# Patient Record
Sex: Female | Born: 1965 | Marital: Married | Smoking: Former smoker
Health system: Southern US, Community
[De-identification: ages and names within clinical notes are randomized; demographics above are authoritative.]

---

## 2013-11-28 ENCOUNTER — Encounter: Payer: Self-pay | Admitting: Internal Medicine

## 2013-11-28 ENCOUNTER — Ambulatory Visit (INDEPENDENT_AMBULATORY_CARE_PROVIDER_SITE_OTHER): Payer: BC Managed Care – PPO | Admitting: Internal Medicine

## 2013-11-28 VITALS — BP 121/80 | HR 64 | Ht 59.0 in | Wt 120.0 lb

## 2013-11-28 DIAGNOSIS — R002 Palpitations: Secondary | ICD-10-CM

## 2013-11-28 NOTE — Progress Notes (Signed)
OFFICE NOTE  Chief Complaint:  Palpitations  Primary Care Physician: No PCP Per Patient  HPI:  Alyssa Silva Is a pleasant 48 year old female who is the daughter of one of my patients. She is of Asian descent however grew up in [place] and speaks [place]. She presents today for recent palpitations. She works in a busy job with the [employer] and is under significant amount of stress. Recently she's been having more palpitations. She reports that she had fewer palpitations when she was exercising but has not exercised recently. Also, her mother recently had an episode where she passed out while in [place]. I believe this is been adding some stress to her. Her palpitations seem to be worse at night or when resting. She feels that her heart rate could just jump up and any time. She has increased her caffeine use recently drinking 1-2 cups of coffee a day. She notes that sometimes after consuming caffeine her palpitations are worse. She denies any significant chest pain or worsening shortness of breath with exertion. She has no medical problems that have been diagnosed and she is not on any medications.  PMHx:  History reviewed. No pertinent past medical history.  History reviewed. No pertinent past surgical history.  FAMHx:  Family History  Problem Relation Age of Onset  . Hypertension Father   . Diabetes Father   . Colon cancer Father   . Pancreatic cancer Brother   . Nephrolithiasis Maternal Grandmother   . Nephrolithiasis Paternal Grandmother     SOCHx:   reports that she has quit smoking. She has never used smokeless tobacco. She reports that she drinks alcohol. She reports that she does not use illicit drugs.  ALLERGIES:  No Known Allergies  ROS: A comprehensive review of systems was negative except for: Cardiovascular: positive for palpitations  HOME MEDS: No current outpatient prescriptions on file.   No current facility-administered medications for this visit.     LABS/IMAGING: No results found for this or any previous visit (from the past 48 hour(s)). No results found.  VITALS: BP 121/80 mmHg  Pulse 64  Ht 4\' 11"  (1.499 m)  Wt 120 lb (54.432 kg)  BMI 24.22 kg/m2  EXAM: General appearance: alert and no distress Neck: no carotid bruit and no JVD Lungs: clear to auscultation bilaterally Heart: regular rate and rhythm, S1, S2 normal, no murmur, click, rub or gallop Abdomen: soft, non-tender; bowel sounds normal; no masses,  no organomegaly Extremities: extremities normal, atraumatic, no cyanosis or edema Pulses: 2+ and symmetric Skin: Skin color, texture, turgor normal. No rashes or lesions Neurologic: Grossly normal Psych: Anxious  EKG: Normal sinus rhythm at 64  ASSESSMENT: 1. Palpitations  PLAN: 1.   [name] is describing palpitations which have become more frequent recently but she's had them for several years. She noticed them less frequently when she was exercising and seemed to have less stress. Recently she's increased her caffeine intake, is having poor sleep and less exercise. I think these are all contributing to her palpitations. She denies any chest pain or worsening shortness of breath. Her EKG is normal. She has no known medical problems and does not take medications. I offered to place her on a monitor for. A time to evaluate for her palpitations, but she would like to make diet dietary and lifestyle modifications first. If her symptoms do not improve, she may need to be monitored. We might also consider a treadmill exercise stress test however it would be unlikely that these  symptoms are ischemic.  Follow-up with me can be on an as-needed basis. Thank you for the kind referral.  Pixie Casino, MD, [facility] Attending Cardiologist [facility]  Alyssa Silva 11/28/2013, 1:30 PM

## 2013-11-28 NOTE — Patient Instructions (Signed)
Your physician recommends that you schedule a follow-up appointment as needed.   Palpitations A palpitation is the feeling that your heartbeat is irregular or is faster than normal. It may feel like your heart is fluttering or skipping a beat. Palpitations are usually not a serious problem. However, in some cases, you may need further medical evaluation. CAUSES  Palpitations can be caused by:  Smoking.  Caffeine or other stimulants, such as diet pills or energy drinks.  Alcohol.  Stress and anxiety.  Strenuous physical activity.  Fatigue.  Certain medicines.  Heart disease, especially if you have a history of irregular heart rhythms (arrhythmias), such as atrial fibrillation, atrial flutter, or supraventricular tachycardia.  An improperly working pacemaker or defibrillator. DIAGNOSIS  To find the cause of your palpitations, your health care provider will take your medical history and perform a physical exam. Your health care provider may also have you take a test called an ambulatory electrocardiogram (ECG). An ECG records your heartbeat patterns over a 24-hour period. You may also have other tests, such as:  Transthoracic echocardiogram (TTE). During echocardiography, sound waves are used to evaluate how blood flows through your heart.  Transesophageal echocardiogram (TEE).  Cardiac monitoring. This allows your health care provider to monitor your heart rate and rhythm in real time.  Holter monitor. This is a portable device that records your heartbeat and can help diagnose heart arrhythmias. It allows your health care provider to track your heart activity for several days, if needed.  Stress tests by exercise or by giving medicine that makes the heart beat faster. TREATMENT  Treatment of palpitations depends on the cause of your symptoms and can vary greatly. Most cases of palpitations do not require any treatment other than time, relaxation, and monitoring your symptoms. Other  causes, such as atrial fibrillation, atrial flutter, or supraventricular tachycardia, usually require further treatment. HOME CARE INSTRUCTIONS   Avoid:  Caffeinated coffee, tea, soft drinks, diet pills, and energy drinks.  Chocolate.  Alcohol.  Stop smoking if you smoke.  Reduce your stress and anxiety. Things that can help you relax include:  A method of controlling things in your body, such as your heartbeats, with your mind (biofeedback).  Yoga.  Meditation.  Physical activity such as swimming, jogging, or walking.  Get plenty of rest and sleep. SEEK MEDICAL CARE IF:   You continue to have a fast or irregular heartbeat beyond 24 hours.  Your palpitations occur more often. SEEK IMMEDIATE MEDICAL CARE IF:  You have chest pain or shortness of breath.  You have a severe headache.  You feel dizzy or you faint. MAKE SURE YOU:  Understand these instructions.  Will watch your condition.  Will get help right away if you are not doing well or get worse. Document Released: 12/27/1999 Document Revised: 01/03/2013 Document Reviewed: 02/27/2011 [facility] Patient Information 2015 [place], [place]. This information is not intended to replace advice given to you by your health care provider. Make sure you discuss any questions you have with your health care provider.

## 2013-12-19 ENCOUNTER — Ambulatory Visit: Payer: Self-pay | Admitting: Internal Medicine

## 2016-09-09 DIAGNOSIS — H04123 Dry eye syndrome of bilateral lacrimal glands: Secondary | ICD-10-CM | POA: Diagnosis not present

## 2016-11-24 DIAGNOSIS — R1031 Right lower quadrant pain: Secondary | ICD-10-CM | POA: Diagnosis not present

## 2016-11-24 DIAGNOSIS — H6122 Impacted cerumen, left ear: Secondary | ICD-10-CM | POA: Diagnosis not present

## 2017-03-29 ENCOUNTER — Other Ambulatory Visit: Payer: Self-pay | Admitting: Family Medicine

## 2017-03-29 ENCOUNTER — Other Ambulatory Visit (HOSPITAL_COMMUNITY)
Admission: RE | Admit: 2017-03-29 | Discharge: 2017-03-29 | Disposition: A | Discharge status: Home / Self Care | Payer: 59 | Source: Ambulatory Visit | Attending: Family Medicine | Admitting: Family Medicine

## 2017-03-29 DIAGNOSIS — Z0000 Encounter for general adult medical examination without abnormal findings: Secondary | ICD-10-CM | POA: Insufficient documentation

## 2017-03-29 DIAGNOSIS — Z13220 Encounter for screening for lipoid disorders: Secondary | ICD-10-CM | POA: Diagnosis not present

## 2017-03-31 LAB — CYTOLOGY - PAP
Diagnosis: NEGATIVE
HPV: NOT DETECTED

## 2017-05-11 DIAGNOSIS — K7581 Nonalcoholic steatohepatitis (NASH): Secondary | ICD-10-CM | POA: Diagnosis not present

## 2017-05-11 DIAGNOSIS — Z1211 Encounter for screening for malignant neoplasm of colon: Secondary | ICD-10-CM | POA: Diagnosis not present

## 2017-05-11 DIAGNOSIS — R1031 Right lower quadrant pain: Secondary | ICD-10-CM | POA: Diagnosis not present

## 2017-06-18 DIAGNOSIS — Z1211 Encounter for screening for malignant neoplasm of colon: Secondary | ICD-10-CM | POA: Diagnosis not present

## 2017-06-18 DIAGNOSIS — D120 Benign neoplasm of cecum: Secondary | ICD-10-CM | POA: Diagnosis not present

## 2017-06-18 DIAGNOSIS — K635 Polyp of colon: Secondary | ICD-10-CM | POA: Diagnosis not present

## 2018-04-07 DIAGNOSIS — L509 Urticaria, unspecified: Secondary | ICD-10-CM | POA: Diagnosis not present

## 2019-10-23 ENCOUNTER — Other Ambulatory Visit: Payer: Self-pay | Admitting: Obstetrics and Gynecology

## 2019-10-23 DIAGNOSIS — R928 Other abnormal and inconclusive findings on diagnostic imaging of breast: Secondary | ICD-10-CM

## 2019-10-30 ENCOUNTER — Ambulatory Visit: Payer: 59

## 2019-10-30 ENCOUNTER — Ambulatory Visit
Admission: RE | Admit: 2019-10-30 | Discharge: 2019-10-30 | Disposition: A | Discharge status: Home / Self Care | Payer: 59 | Source: Ambulatory Visit | Attending: Obstetrics and Gynecology | Admitting: Obstetrics and Gynecology

## 2019-10-30 ENCOUNTER — Other Ambulatory Visit: Payer: Self-pay

## 2019-10-30 DIAGNOSIS — R928 Other abnormal and inconclusive findings on diagnostic imaging of breast: Secondary | ICD-10-CM

## 2021-10-31 IMAGING — MG MM DIGITAL DIAGNOSTIC UNILAT*L* W/ TOMO W/ CAD
4 series · 4 of 12 positions shown · non-contrast
Comparison: Previous exam(s).

CLINICAL DATA: Patient recalled from screening for left breast
asymmetry.

EXAM:
DIGITAL DIAGNOSTIC UNILATERAL LEFT MAMMOGRAM WITH TOMO AND CAD

[L ML synth-2D]
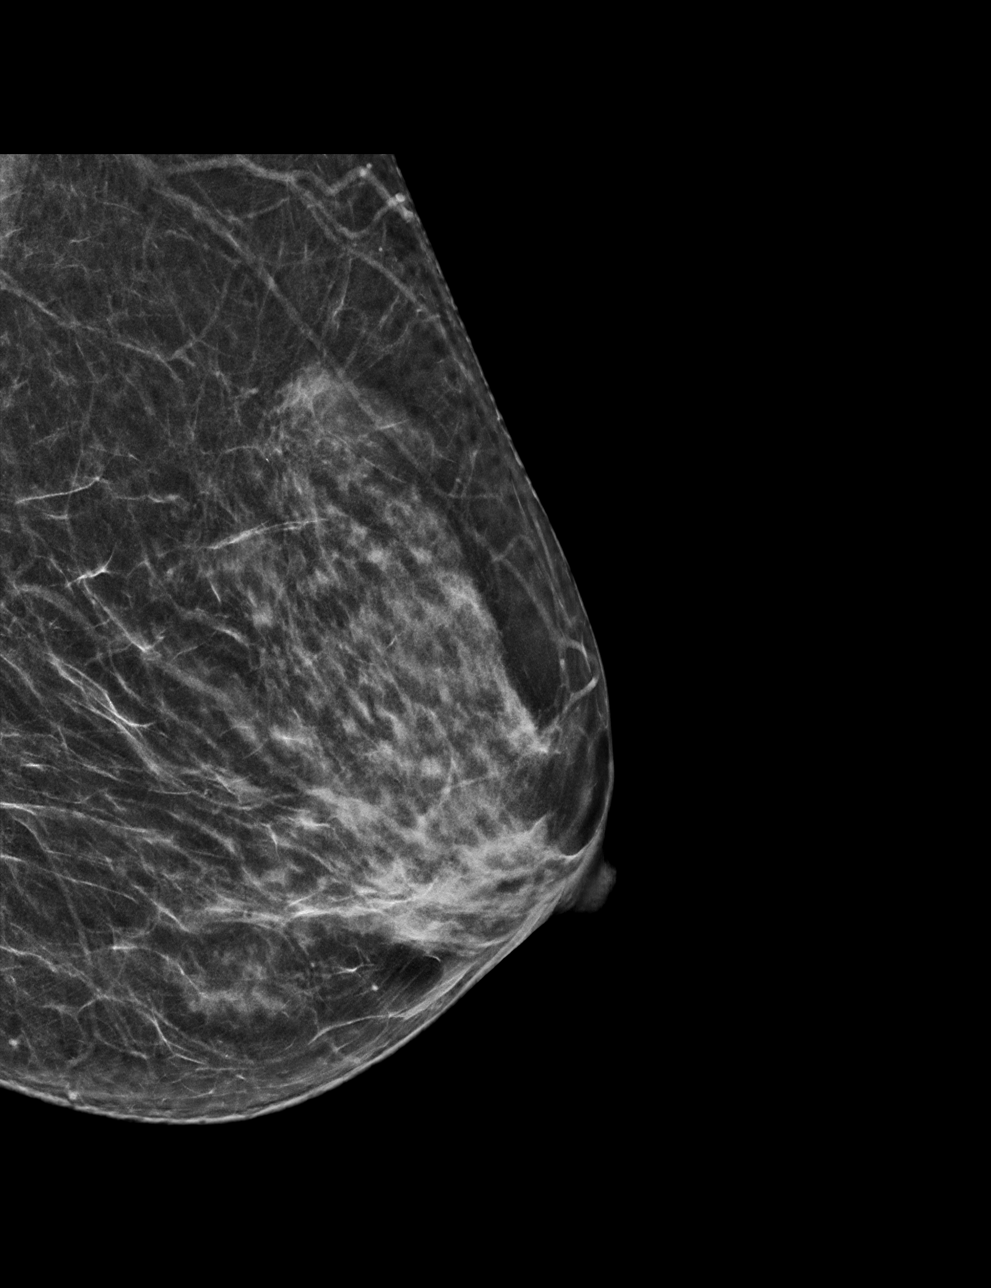

[L MLO synth-2D]
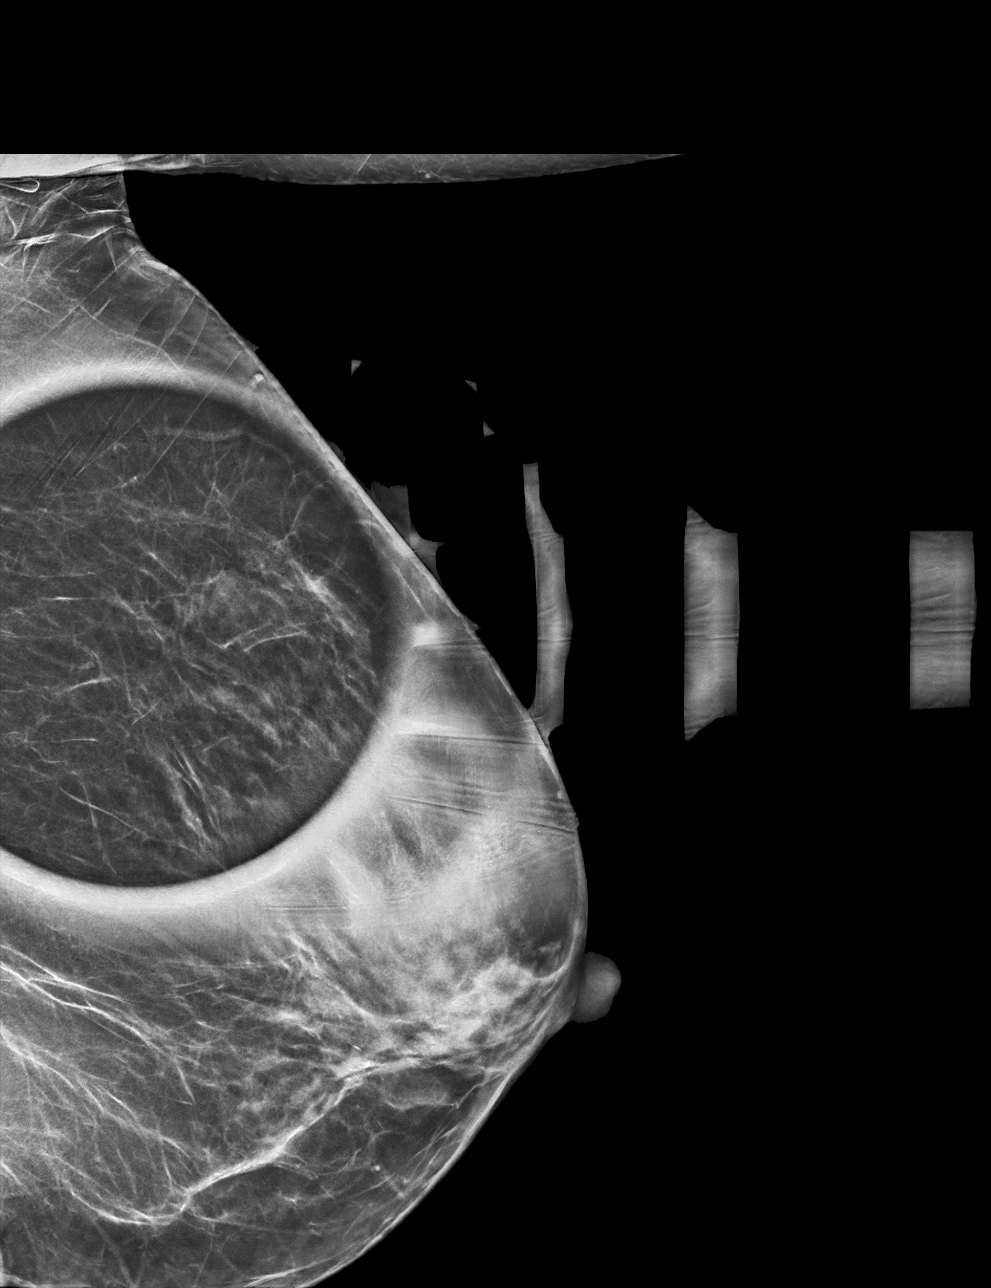

[L ML tomo · tomo slice 28/55.0]
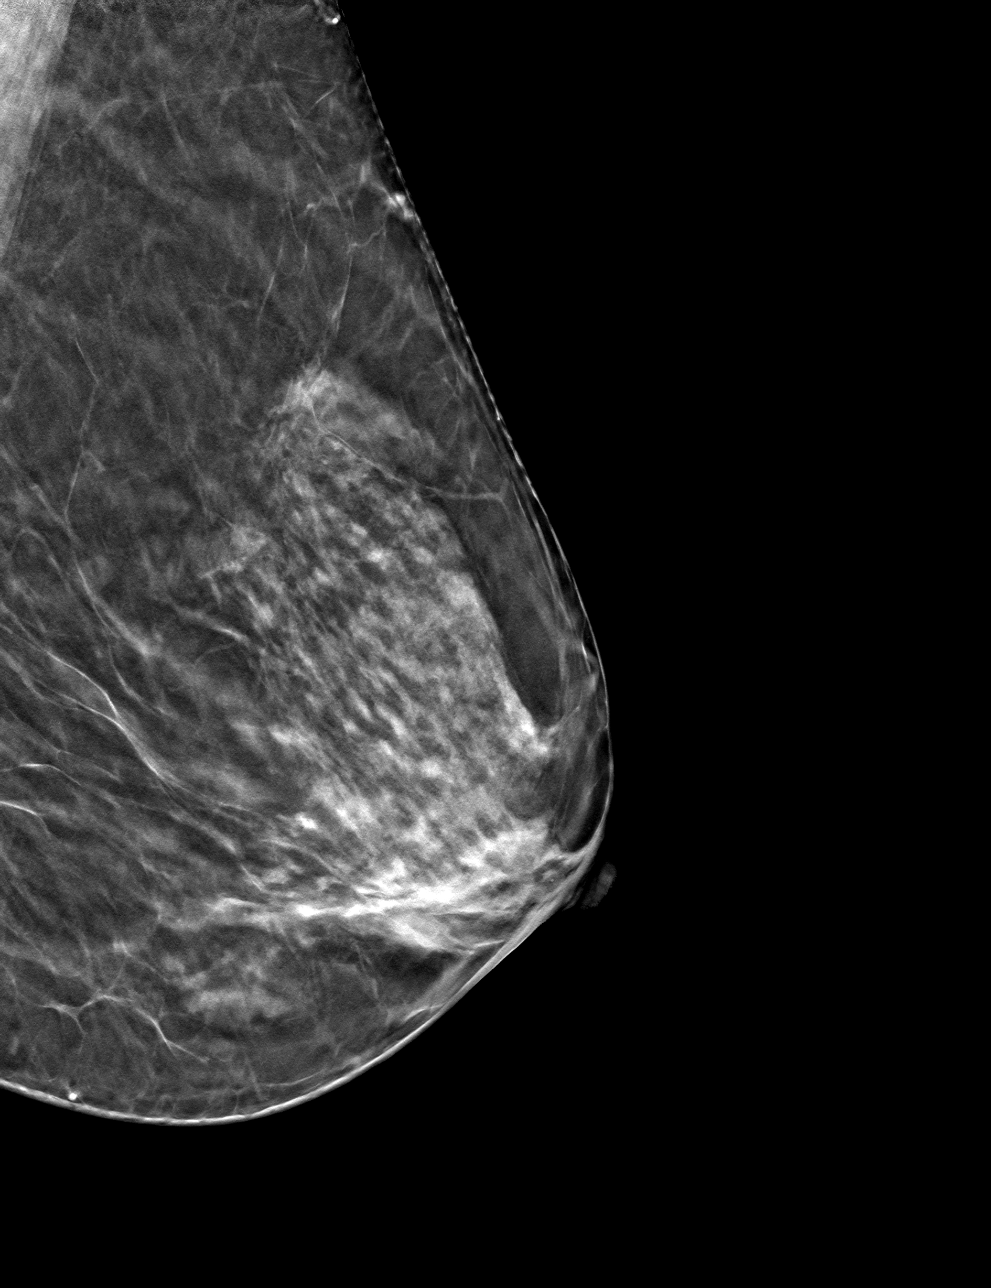

[L MLO tomo · tomo slice 27/53.0]
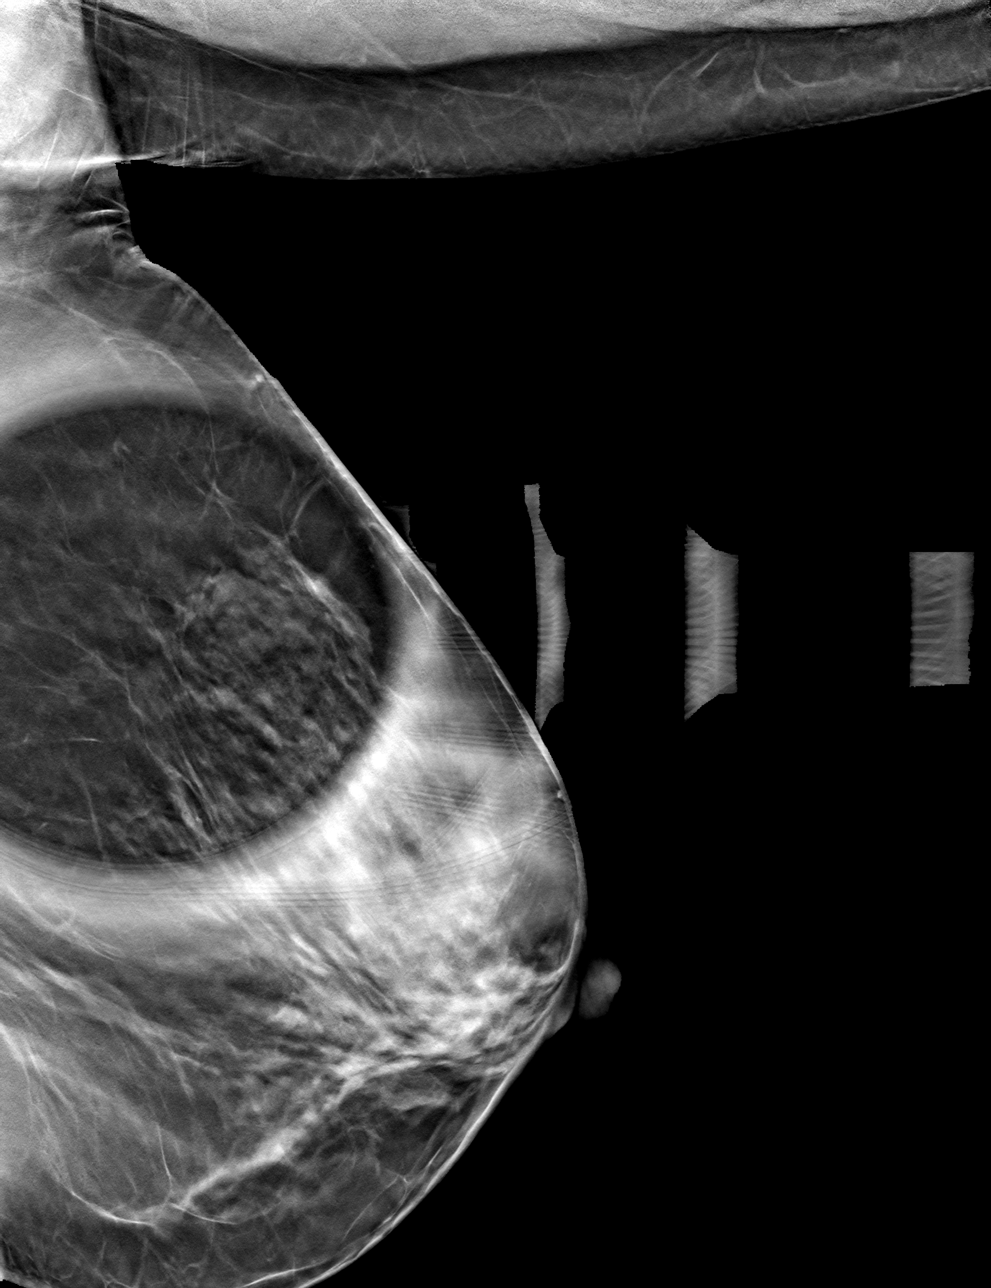

[4 of 12 positions shown; findings below may reference images not displayed]

ACR Breast Density Category c: The breast tissue is heterogeneously
dense, which may obscure small masses.
FINDINGS: Questioned asymmetry within the superior left breast on the MLO view
resolved with additional imaging compatible with dense overlapping
fibroglandular tissue. No suspicious findings.

Mammographic images were processed with CAD.
IMPRESSION: No mammographic evidence for malignancy.

RECOMMENDATION:
Screening mammogram in one year.(Code:6O-5-T30)

I have discussed the findings and recommendations with the patient.
If applicable, a reminder letter will be sent to the patient
regarding the next appointment.

BI-RADS CATEGORY  1: Negative.
